# Patient Record
Sex: Male | Born: 1970 | Race: Black or African American | Hispanic: No | Marital: Single | Smoking: Never smoker
Health system: Southern US, Community
[De-identification: ages and names within clinical notes are randomized; demographics above are authoritative.]

## PROBLEM LIST (undated history)

## (undated) DIAGNOSIS — F329 Major depressive disorder, single episode, unspecified: Secondary | ICD-10-CM

## (undated) DIAGNOSIS — E079 Disorder of thyroid, unspecified: Secondary | ICD-10-CM

## (undated) DIAGNOSIS — F32A Depression, unspecified: Secondary | ICD-10-CM

## (undated) DIAGNOSIS — F419 Anxiety disorder, unspecified: Secondary | ICD-10-CM

## (undated) DIAGNOSIS — L732 Hidradenitis suppurativa: Secondary | ICD-10-CM

---

## 2007-11-10 ENCOUNTER — Ambulatory Visit: Payer: Self-pay | Admitting: Specialist

## 2007-11-19 ENCOUNTER — Emergency Department: Payer: Self-pay | Admitting: Emergency Medicine

## 2007-11-19 ENCOUNTER — Other Ambulatory Visit: Payer: Self-pay

## 2007-11-21 ENCOUNTER — Ambulatory Visit: Payer: Self-pay | Admitting: General Surgery

## 2007-12-02 ENCOUNTER — Ambulatory Visit: Payer: Self-pay | Admitting: General Surgery

## 2008-09-22 ENCOUNTER — Emergency Department: Payer: Self-pay | Admitting: Emergency Medicine

## 2009-08-08 ENCOUNTER — Emergency Department: Payer: Self-pay | Admitting: Emergency Medicine

## 2009-08-22 ENCOUNTER — Emergency Department: Payer: Self-pay | Admitting: Emergency Medicine

## 2009-12-06 ENCOUNTER — Emergency Department (HOSPITAL_COMMUNITY): Admission: EM | Admit: 2009-12-06 | Discharge: 2009-12-06 | Payer: Self-pay | Admitting: Emergency Medicine

## 2010-06-15 LAB — URINALYSIS, ROUTINE W REFLEX MICROSCOPIC
Glucose, UA: NEGATIVE mg/dL
Ketones, ur: NEGATIVE mg/dL
Protein, ur: NEGATIVE mg/dL
Urobilinogen, UA: 0.2 mg/dL (ref 0.0–1.0)

## 2010-06-15 LAB — POCT I-STAT, CHEM 8
Calcium, Ion: 1.18 mmol/L (ref 1.12–1.32)
Glucose, Bld: 85 mg/dL (ref 70–99)
HCT: 50 % (ref 39.0–52.0)
Hemoglobin: 17 g/dL (ref 13.0–17.0)
Potassium: 4 mEq/L (ref 3.5–5.1)

## 2010-06-15 LAB — URINE CULTURE
Colony Count: NO GROWTH
Culture: NO GROWTH

## 2010-11-15 ENCOUNTER — Emergency Department: Payer: Self-pay | Admitting: Emergency Medicine

## 2011-05-14 IMAGING — CT CT ABD-PELV W/O CM
2 of 4 series · 17 of 46 positions shown, 19 images · non-contrast
Comparison: None

CLINICAL DATA: Left-sided back pain.  Nausea, vomiting.  History of
stones.

CT ABDOMEN AND PELVIS WITHOUT CONTRAST
TECHNIQUE: Multidetector CT imaging of the abdomen and pelvis was
performed following the standard protocol without intravenous
contrast.

[Series 2: a/p w/o 5.0 b31f st · axial · non-contrast · 0.87mm/px · z∈[+774,+1244]mm · 14 of 104 slices shown, 16 images]
[im 5/104  soft-tissue]
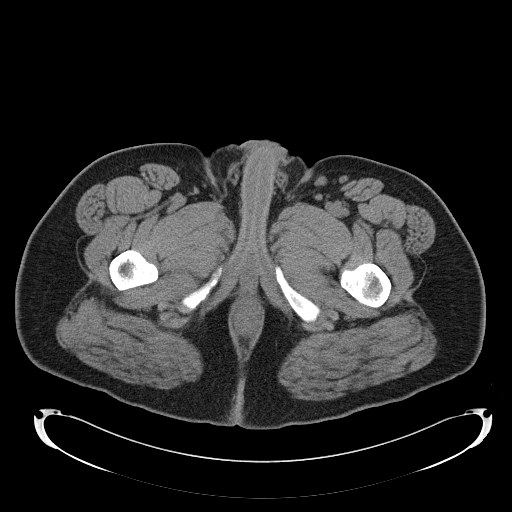
[im 5/104  bone]
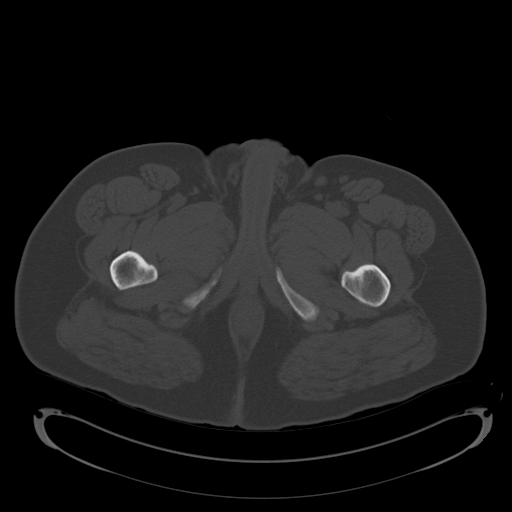
[im 13/104  soft-tissue]
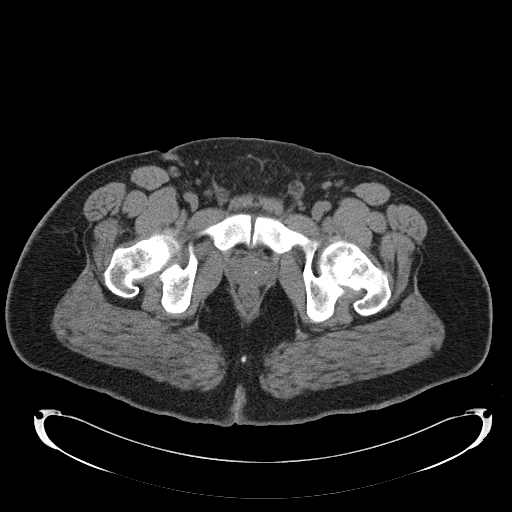
[im 22/104  soft-tissue]
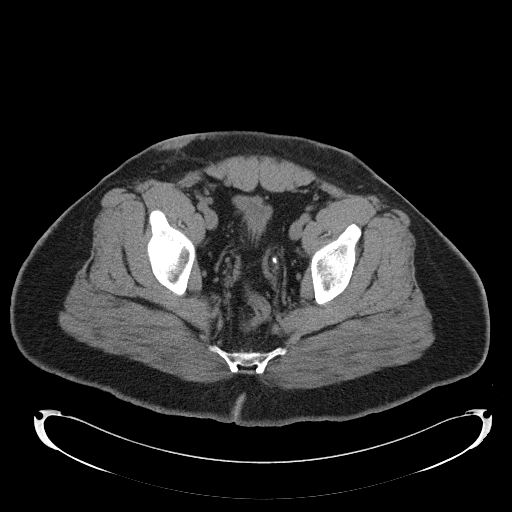
[im 26/104  soft-tissue]
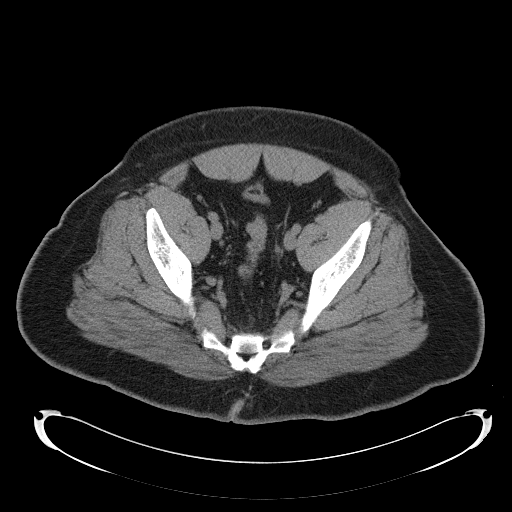
[im 35/104  soft-tissue]
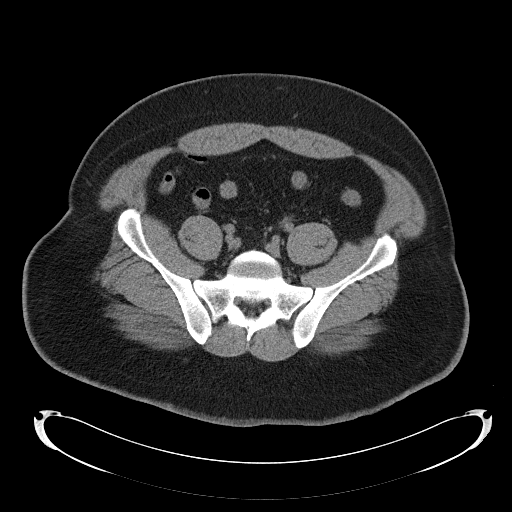
[im 43/104  soft-tissue]
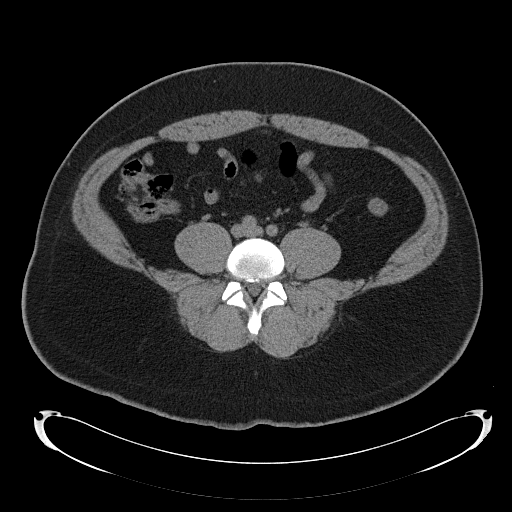
[im 48/104  soft-tissue]
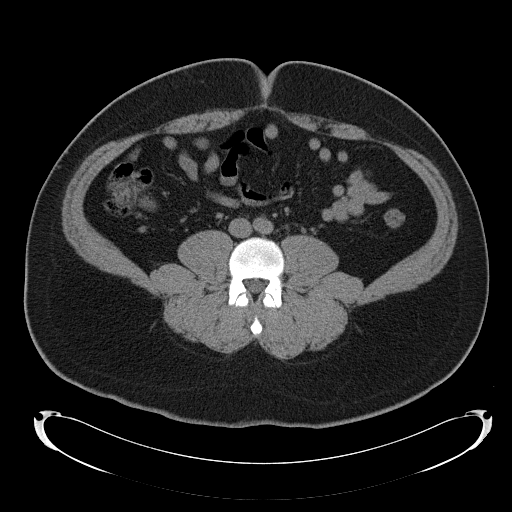
[im 56/104  soft-tissue]
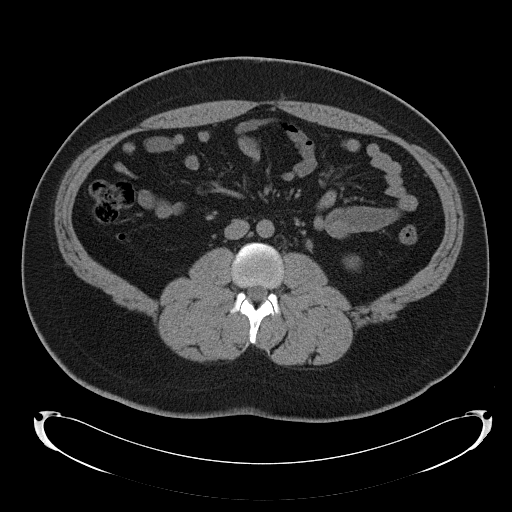
[im 61/104  soft-tissue]
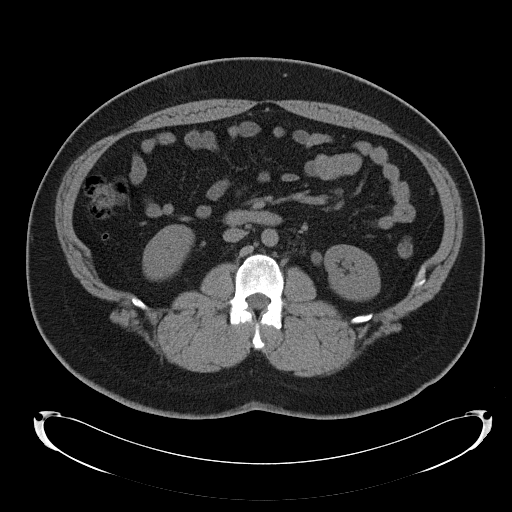
[im 61/104  bone]
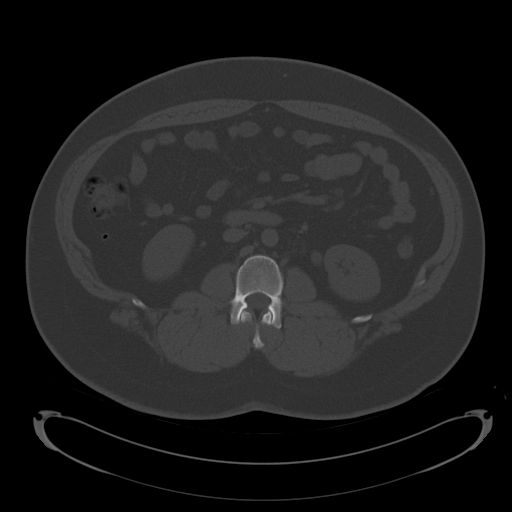
[im 69/104  soft-tissue]
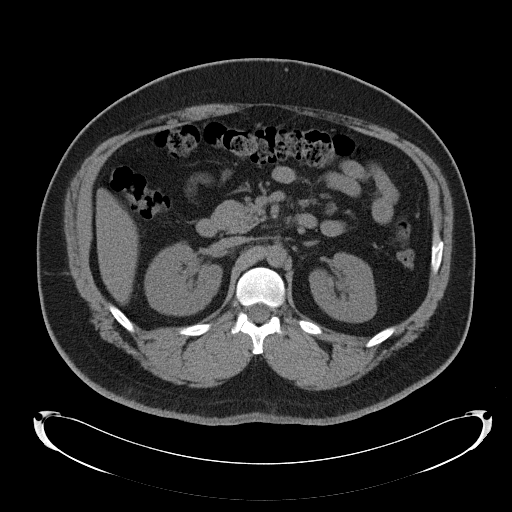
[im 78/104  soft-tissue]
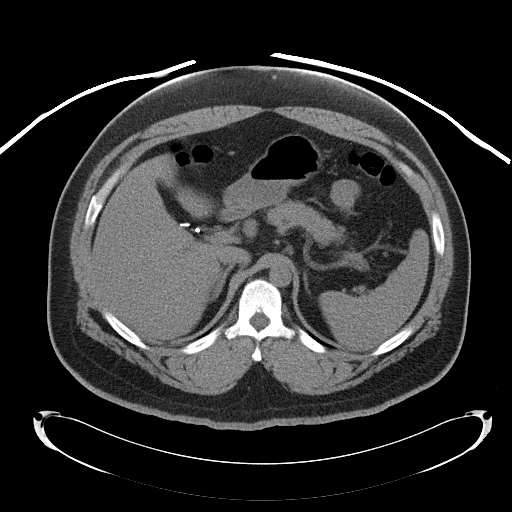
[im 82/104  soft-tissue]
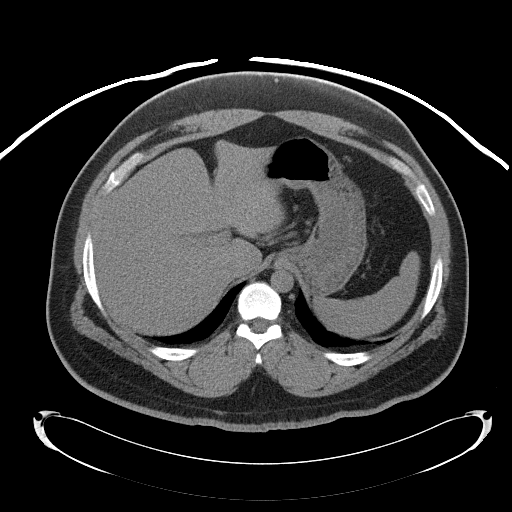
[im 91/104  soft-tissue]
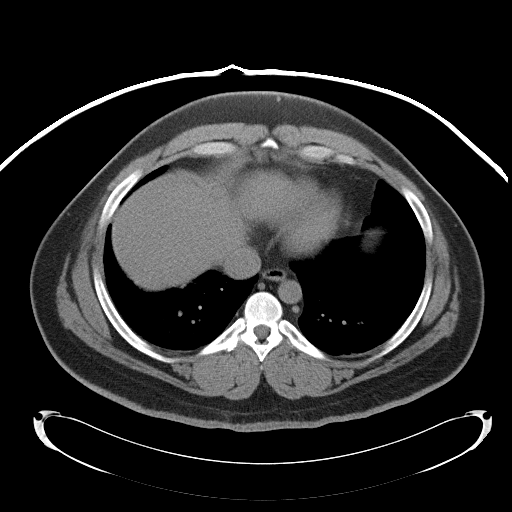
[im 99/104  soft-tissue]
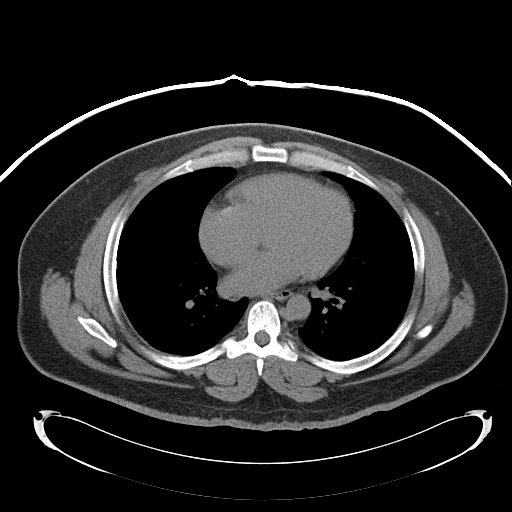

[Series 5: a/p w/o 2.0 spo cor st · coronal · non-contrast · 1.01mm/px · 3 of 162 slices shown]
[im 54/162  soft-tissue]
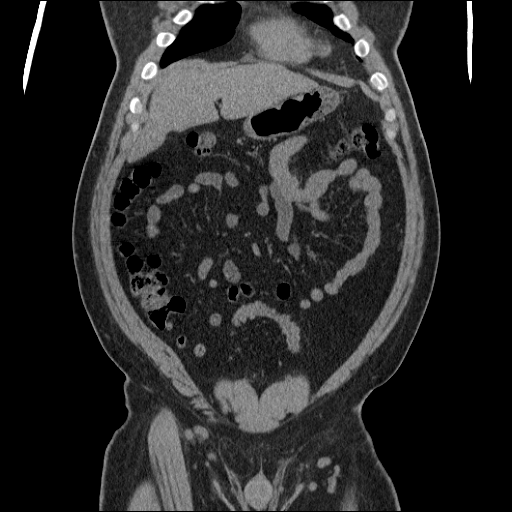
[im 72/162  soft-tissue]
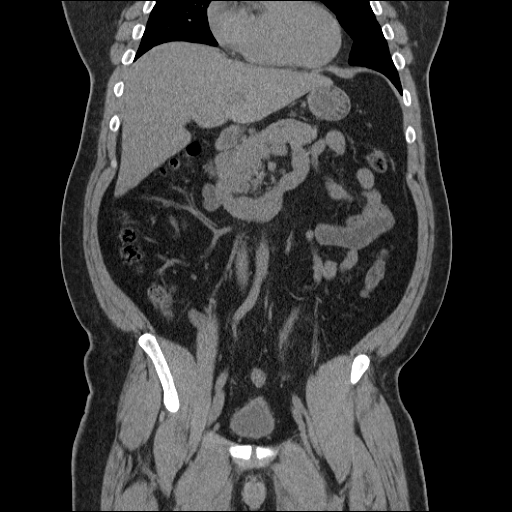
[im 90/162  soft-tissue]
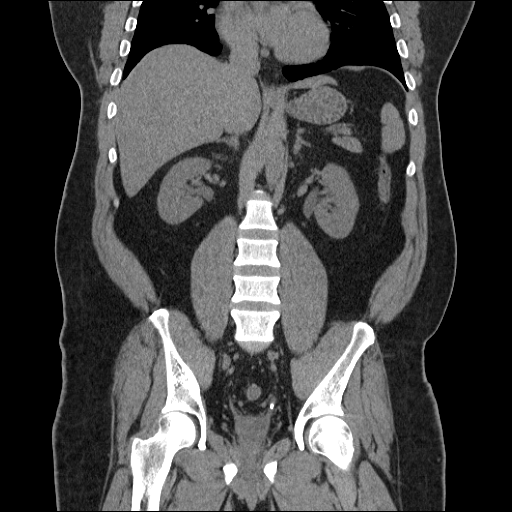

[17 of 46 positions shown; findings below may reference images not displayed]

FINDINGS: Just proximal to the ureteral vesicle junction, there is
a 4 mm calculus on the left.  This causes minimal periureteral
stranding and mild left hydronephrosis.  There are intrarenal
calculi bilaterally, measuring 3 - 4 mm each.

There is streaky atelectasis at the lung bases.  No focal
abnormality is identified within the liver, spleen, pancreas,
adrenal glands.  The appendix is well seen and has a normal
appearance.  No evidence for bowel obstruction or bowel wall
thickening.  No retroperitoneal adenopathy or ascites. Patient has
had prior cholecystectomy.

Within the right lower anterior abdominal wall, there is
subcutaneous soft tissue density, raising the question of recent
injections.
IMPRESSION: 1.  Left distal ureteral calculus measuring 4 mm, resulting in mild
left hydronephrosis.
2.  Numerous intrarenal calculi bilaterally.

## 2018-06-22 ENCOUNTER — Emergency Department

## 2018-06-22 ENCOUNTER — Other Ambulatory Visit: Payer: Self-pay

## 2018-06-22 ENCOUNTER — Emergency Department: Payer: Self-pay

## 2018-06-22 ENCOUNTER — Emergency Department
Admission: EM | Admit: 2018-06-22 | Discharge: 2018-06-22 | Disposition: A | Discharge status: Home / Self Care | Attending: Emergency Medicine | Admitting: Emergency Medicine

## 2018-06-22 DIAGNOSIS — S46911A Strain of unspecified muscle, fascia and tendon at shoulder and upper arm level, right arm, initial encounter: Secondary | ICD-10-CM | POA: Diagnosis not present

## 2018-06-22 DIAGNOSIS — Y999 Unspecified external cause status: Secondary | ICD-10-CM | POA: Diagnosis not present

## 2018-06-22 DIAGNOSIS — S0083XA Contusion of other part of head, initial encounter: Secondary | ICD-10-CM | POA: Insufficient documentation

## 2018-06-22 DIAGNOSIS — T07XXXA Unspecified multiple injuries, initial encounter: Secondary | ICD-10-CM

## 2018-06-22 DIAGNOSIS — Y9389 Activity, other specified: Secondary | ICD-10-CM | POA: Insufficient documentation

## 2018-06-22 DIAGNOSIS — S060X1A Concussion with loss of consciousness of 30 minutes or less, initial encounter: Secondary | ICD-10-CM

## 2018-06-22 DIAGNOSIS — Z23 Encounter for immunization: Secondary | ICD-10-CM | POA: Diagnosis not present

## 2018-06-22 DIAGNOSIS — S0990XA Unspecified injury of head, initial encounter: Secondary | ICD-10-CM

## 2018-06-22 DIAGNOSIS — Y92149 Unspecified place in prison as the place of occurrence of the external cause: Secondary | ICD-10-CM | POA: Insufficient documentation

## 2018-06-22 DIAGNOSIS — S0181XA Laceration without foreign body of other part of head, initial encounter: Secondary | ICD-10-CM

## 2018-06-22 HISTORY — DX: Anxiety disorder, unspecified: F41.9

## 2018-06-22 HISTORY — DX: Major depressive disorder, single episode, unspecified: F32.9

## 2018-06-22 HISTORY — DX: Depression, unspecified: F32.A

## 2018-06-22 MED ORDER — TETANUS-DIPHTH-ACELL PERTUSSIS 5-2.5-18.5 LF-MCG/0.5 IM SUSP
0.5000 mL | Freq: Once | INTRAMUSCULAR | Status: AC
  Administered 2018-06-22: 0.5 mL via INTRAMUSCULAR
  Filled 2018-06-22: qty 0.5

## 2018-06-22 MED ORDER — OXYCODONE-ACETAMINOPHEN 5-325 MG PO TABS
1.0000 | ORAL_TABLET | Freq: Once | ORAL | Status: AC
Start: 2018-06-22 — End: 2018-06-22
  Administered 2018-06-22: 1 via ORAL
  Filled 2018-06-22: qty 1

## 2018-06-22 MED ORDER — OXYCODONE-ACETAMINOPHEN 5-325 MG PO TABS
1.0000 | ORAL_TABLET | Freq: Once | ORAL | Status: AC
  Administered 2018-06-22: 1 via ORAL
  Filled 2018-06-22: qty 1
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
## 2018-06-22 NOTE — ED Notes (Signed)
Pt and officers verbalized understanding of discharge instructions. NAD at this time.
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
## 2018-06-22 NOTE — ED Notes (Addendum)
Pt changed and placed in hospital gown and wound above eye cleaned and dressed to prevent re-occurrence of bleeding. Pt with c/o of head and right side pain. Pt states unable to lift right arm above head. Pt has large hematoma to forehead. Pt has multiple scraps and marks on face and torso. MD McShane at bedside for assessment.

## 2018-08-25 ENCOUNTER — Encounter: Payer: Self-pay | Admitting: Emergency Medicine

## 2018-08-25 ENCOUNTER — Emergency Department: Payer: Medicare Other

## 2018-08-25 ENCOUNTER — Emergency Department
Admission: EM | Admit: 2018-08-25 | Discharge: 2018-08-26 | Disposition: A | Discharge status: Home / Self Care | Payer: Medicare Other | Attending: Emergency Medicine | Admitting: Emergency Medicine

## 2018-08-25 ENCOUNTER — Other Ambulatory Visit: Payer: Self-pay

## 2018-08-25 DIAGNOSIS — R509 Fever, unspecified: Secondary | ICD-10-CM | POA: Diagnosis present

## 2018-08-25 DIAGNOSIS — Z20828 Contact with and (suspected) exposure to other viral communicable diseases: Secondary | ICD-10-CM | POA: Insufficient documentation

## 2018-08-25 DIAGNOSIS — J111 Influenza due to unidentified influenza virus with other respiratory manifestations: Secondary | ICD-10-CM | POA: Insufficient documentation

## 2018-08-25 LAB — COMPREHENSIVE METABOLIC PANEL
ALT: 29 U/L (ref 0–44)
AST: 22 U/L (ref 15–41)
Albumin: 3.4 g/dL — ABNORMAL LOW (ref 3.5–5.0)
Alkaline Phosphatase: 66 U/L (ref 38–126)
Anion gap: 9 (ref 5–15)
BUN: 18 mg/dL (ref 6–20)
CO2: 23 mmol/L (ref 22–32)
Calcium: 8.7 mg/dL — ABNORMAL LOW (ref 8.9–10.3)
Chloride: 106 mmol/L (ref 98–111)
Creatinine, Ser: 1.11 mg/dL (ref 0.61–1.24)
GFR calc Af Amer: >60 mL/min (ref >60)
GFR calc non Af Amer: >60 mL/min (ref >60)
Glucose, Bld: 112 mg/dL — ABNORMAL HIGH (ref 70–99)
Potassium: 3.8 mmol/L (ref 3.5–5.1)
Sodium: 138 mmol/L (ref 135–145)
Total Bilirubin: 0.4 mg/dL (ref 0.3–1.2)
Total Protein: 8.3 g/dL — ABNORMAL HIGH (ref 6.5–8.1)

## 2018-08-25 LAB — CBC
HCT: 47.9 % (ref 39.0–52.0)
Hemoglobin: 14.9 g/dL (ref 13.0–17.0)
MCH: 25.5 pg — ABNORMAL LOW (ref 26.0–34.0)
MCHC: 31.1 g/dL (ref 30.0–36.0)
MCV: 81.9 fL (ref 80.0–100.0)
Platelets: 255 10*3/uL (ref 150–400)
RBC: 5.85 MIL/uL — ABNORMAL HIGH (ref 4.22–5.81)
RDW: 13.8 % (ref 11.5–15.5)
WBC: 8 10*3/uL (ref 4.0–10.5)
nRBC: 0 % (ref 0.0–0.2)
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
## 2018-08-26 LAB — SARS CORONAVIRUS 2 BY RT PCR (HOSPITAL ORDER, PERFORMED IN ~~LOC~~ HOSPITAL LAB): SARS Coronavirus 2: NEGATIVE

## 2018-08-26 MED ORDER — SODIUM CHLORIDE 0.9 % IV BOLUS
1000.0000 mL | Freq: Once | INTRAVENOUS | Status: AC
Start: 2018-08-26 — End: 2018-08-26
  Administered 2018-08-26: 02:00:00 1000 mL via INTRAVENOUS

## 2018-08-26 MED ORDER — ALBUTEROL SULFATE HFA 108 (90 BASE) MCG/ACT IN AERS: 2.0000 | INHALATION_SPRAY | Freq: Four times a day (QID) | RESPIRATORY_TRACT | 0 refills | Status: AC | PRN

## 2018-08-26 NOTE — Discharge Instructions (Addendum)
Please seek medical attention for any high fevers, chest pain, shortness of breath, change in behavior, persistent vomiting, bloody stool or any other new or concerning symptoms.  

## 2018-08-26 NOTE — ED Notes (Signed)
Vitals stable. NAD. AAOx4 

## 2021-09-05 ENCOUNTER — Other Ambulatory Visit (HOSPITAL_COMMUNITY): Payer: Self-pay

## 2022-07-03 ENCOUNTER — Emergency Department (HOSPITAL_COMMUNITY): Payer: Medicare Other

## 2022-07-03 ENCOUNTER — Encounter (HOSPITAL_COMMUNITY): Payer: Self-pay

## 2022-07-03 ENCOUNTER — Emergency Department (HOSPITAL_COMMUNITY)
Admission: EM | Admit: 2022-07-03 | Discharge: 2022-07-04 | Disposition: A | Discharge status: Home / Self Care | Payer: Medicare Other | Attending: Emergency Medicine | Admitting: Emergency Medicine

## 2022-07-03 ENCOUNTER — Other Ambulatory Visit: Payer: Self-pay

## 2022-07-03 DIAGNOSIS — G43809 Other migraine, not intractable, without status migrainosus: Secondary | ICD-10-CM | POA: Insufficient documentation

## 2022-07-03 DIAGNOSIS — R0789 Other chest pain: Secondary | ICD-10-CM | POA: Insufficient documentation

## 2022-07-03 DIAGNOSIS — R519 Headache, unspecified: Secondary | ICD-10-CM | POA: Diagnosis present

## 2022-07-03 HISTORY — DX: Hidradenitis suppurativa: L73.2

## 2022-07-03 HISTORY — DX: Disorder of thyroid, unspecified: E07.9

## 2022-07-03 LAB — CBC
HCT: 45.1 % (ref 39.0–52.0)
Hemoglobin: 14.4 g/dL (ref 13.0–17.0)
MCH: 26.1 pg (ref 26.0–34.0)
MCHC: 31.9 g/dL (ref 30.0–36.0)
MCV: 81.9 fL (ref 80.0–100.0)
Platelets: 263 10*3/uL (ref 150–400)
RBC: 5.51 MIL/uL (ref 4.22–5.81)
RDW: 15.2 % (ref 11.5–15.5)
WBC: 10 10*3/uL (ref 4.0–10.5)
nRBC: 0 % (ref 0.0–0.2)

## 2022-07-03 LAB — BASIC METABOLIC PANEL
Anion gap: 8 (ref 5–15)
BUN: 13 mg/dL (ref 6–20)
CO2: 23 mmol/L (ref 22–32)
Calcium: 8.9 mg/dL (ref 8.9–10.3)
Chloride: 105 mmol/L (ref 98–111)
Creatinine, Ser: 1.07 mg/dL (ref 0.61–1.24)
GFR, Estimated: >60 mL/min (ref >60)
Glucose, Bld: 120 mg/dL — ABNORMAL HIGH (ref 70–99)
Potassium: 3.7 mmol/L (ref 3.5–5.1)
Sodium: 136 mmol/L (ref 135–145)

## 2022-07-03 LAB — TROPONIN I (HIGH SENSITIVITY): Troponin I (High Sensitivity): 4 ng/L (ref <18)

## 2022-07-03 MED ORDER — PROCHLORPERAZINE EDISYLATE 10 MG/2ML IJ SOLN
10.0000 mg | Freq: Once | INTRAMUSCULAR | Status: AC
  Administered 2022-07-03: 10 mg via INTRAVENOUS
  Filled 2022-07-03: qty 2

## 2022-07-03 MED ORDER — DIPHENHYDRAMINE HCL 50 MG/ML IJ SOLN
12.5000 mg | Freq: Once | INTRAMUSCULAR | Status: AC
  Administered 2022-07-03: 12.5 mg via INTRAVENOUS
  Filled 2022-07-03: qty 1

## 2022-07-03 NOTE — ED Triage Notes (Addendum)
Pt presents with a 4 hour hx of an achy substernal CP without radiation that started while he was lying on the cough. Pt reports associated ShOB, nausea, and a clammy feeling. Pt took ibuprofen 600 mg at 20:30 without relief.

## 2022-07-03 NOTE — ED Notes (Addendum)
Patient transported to XR. 

## 2022-07-04 LAB — URINALYSIS, ROUTINE W REFLEX MICROSCOPIC
Bilirubin Urine: NEGATIVE
Glucose, UA: NEGATIVE mg/dL
Hgb urine dipstick: NEGATIVE
Ketones, ur: NEGATIVE mg/dL
Leukocytes,Ua: NEGATIVE
Nitrite: NEGATIVE
Protein, ur: NEGATIVE mg/dL
Specific Gravity, Urine: 1.015 (ref 1.005–1.030)
pH: 5 (ref 5.0–8.0)

## 2022-07-04 LAB — TROPONIN I (HIGH SENSITIVITY): Troponin I (High Sensitivity): 5 ng/L (ref <18)

## 2022-07-04 NOTE — ED Provider Notes (Signed)
[place] Provider Note   CSN: [id number] Arrival date & time: 07/03/22  2159     History  Chief Complaint  Patient presents with   Chest Pain    Robert Branch is a 52 y.o. male.  HPI     Is a 52 year old male who presents with chest pain and headache.  Patient reports a 4-hour history of anterior chest discomfort.  He states he is not able to describe it.  It does not radiate.  Nothing seems to make it better or worse including exertion.  Denies history of heart disease, diabetes, hypertension, smoking.  Patient also is reporting headache.  He describes pain behind the right eye.  He has a history of headaches and migraines in the past.  This is consistent with his prior headache.  Denies any strokelike symptoms.  Home Medications Prior to Admission medications   Medication Sig Start Date End Date Taking? Authorizing Provider  FLUoxetine (PROZAC) 40 MG capsule Take 80 mg by mouth daily. 03/20/22  Yes [provider]  gabapentin (NEURONTIN) 300 MG capsule Take by mouth. 02/05/22  Yes [provider]  levothyroxine (SYNTHROID) 100 MCG tablet See admin instructions. 08/14/21  Yes [provider]  LORazepam (ATIVAN) 0.5 MG tablet Take 1 tablet by mouth 2 (two) times daily as needed. 06/20/22  Yes [provider]  risperiDONE (RISPERDAL) 1 MG tablet Take by mouth. 06/19/22 07/20/22 Yes [provider]  albuterol (VENTOLIN HFA) 108 (90 Base) MCG/ACT inhaler Inhale 2 puffs into the lungs every 6 (six) hours as needed for wheezing or shortness of breath. 08/26/18   Nance Pear, MD      Allergies    Demerol [meperidine hcl] and Fluoxetine    Review of Systems   Review of Systems  Constitutional:  Negative for fever.  Respiratory:  Negative for cough and shortness of breath.   Cardiovascular:  Positive for chest pain. Negative for leg swelling.  Neurological:  Positive for headaches.  All  other systems reviewed and are negative.   Physical Exam Updated Vital Signs BP 115/75   Pulse 73   Temp 97.7 F (36.5 C) (Oral)   Resp 18   Ht 1.676 m (5\' 6" )   Wt 99.8 kg   SpO2 93%   BMI 35.51 kg/m  Physical Exam Vitals and nursing note reviewed.  Constitutional:      Appearance: He is well-developed. He is obese. He is not ill-appearing.  HENT:     Head: Normocephalic and atraumatic.  Eyes:     Pupils: Pupils are equal, round, and reactive to light.  Cardiovascular:     Rate and Rhythm: Normal rate and regular rhythm.     Heart sounds: Normal heart sounds. No murmur heard. Pulmonary:     Effort: Pulmonary effort is normal. No respiratory distress.     Breath sounds: Normal breath sounds. No wheezing.  Abdominal:     General: Bowel sounds are normal.     Palpations: Abdomen is soft.     Tenderness: There is no abdominal tenderness. There is no rebound.  Musculoskeletal:     Cervical back: Neck supple.     Right lower leg: No edema.     Left lower leg: No edema.  Lymphadenopathy:     Cervical: No cervical adenopathy.  Skin:    General: Skin is warm and dry.  Neurological:     Mental Status: He is alert and oriented to person, place, and time.  Psychiatric:        Mood and Affect: Mood normal.     ED Results / Procedures / Treatments   Labs (all labs ordered are listed, but only abnormal results are displayed) Labs Reviewed  BASIC METABOLIC PANEL - Abnormal; Notable for the following components:      Result Value   Glucose, Bld 120 (*)    All other components within normal limits  URINALYSIS, ROUTINE W REFLEX MICROSCOPIC - Abnormal; Notable for the following components:   APPearance HAZY (*)    All other components within normal limits  CBC  TROPONIN I (HIGH SENSITIVITY)  TROPONIN I (HIGH SENSITIVITY)    EKG EKG Interpretation  Date/Time:  Tuesday July 03 2022 22:03:50 EDT Ventricular Rate:  91 PR Interval:  134 QRS Duration: 80 QT  Interval:  392 QTC Calculation: 482 R Axis:   40 Text Interpretation: Normal sinus rhythm Prolonged QT Abnormal ECG When compared with ECG of 19-Nov-2007 20:05, QT has lengthened Confirmed by Thayer Jew [phone]) on 07/03/2022 11:26:40 PM  Radiology DG Chest 2 View  Result Date: 07/03/2022 CLINICAL DATA:  Chest pain EXAM: CHEST - 2 VIEW COMPARISON:  08/25/2018 FINDINGS: Chronic bronchitic changes. No consolidation or effusion. Patchy atelectasis or infiltrate at the left lower lung. Normal cardiac size. No pneumothorax. IMPRESSION: Bronchitic changes with patchy atelectasis or minimal infiltrate at the left lung base Electronically Signed   By: Donavan Foil M.D.   On: 07/03/2022 22:49    Procedures Procedures    Medications Ordered in ED Medications  prochlorperazine (COMPAZINE) injection 10 mg (10 mg Intravenous Given 07/03/22 2352)  diphenhydrAMINE (BENADRYL) injection 12.5 mg (12.5 mg Intravenous Given 07/03/22 2352)    ED Course/ Medical Decision Making/ A&P                             Medical Decision Making Amount and/or Complexity of Data Reviewed Labs: ordered. Radiology: ordered.  Risk Prescription drug management.   This patient presents to the ED for concern of headache, chest discomfort, this involves an extensive number of treatment options, and is a complaint that carries with it a high risk of complications and morbidity.  I considered the following differential and admission for this acute, potentially life threatening condition.  The differential diagnosis includes ACS, PE, pneumothorax, pneumonia,  MDM:    This is a 52 year old male who presents with chest pain and headache.  He is overall nontoxic and vital signs are reassuring.  His physical exam is fairly benign.  He was given a migraine cocktail.  EKG without acute ischemic changes.  Basic labs including 2 troponins are negative.  Doubt PE.  Chest x-ray does suggest possible infiltrate and bronchitis changes  although clinically he is not presenting with primary respiratory symptoms.  He has no leukocytosis.  Patient reports improvement after migraine cocktail.  Will refer to outpatient cardiology regarding chest pain.  He is fairly low risk otherwise.  (Labs, imaging, consults)  Labs: I Ordered, and personally interpreted labs.  The pertinent results include: CBC, BMP, troponin x 2  Imaging Studies ordered: I ordered imaging studies including chest x-ray I independently visualized and interpreted imaging. I agree with the radiologist interpretation  Additional history obtained from wife at bedside.  External records from outside source obtained and reviewed including prior evaluations  Cardiac Monitoring: The patient was maintained on a cardiac monitor.  If on the cardiac monitor, I personally viewed and interpreted the cardiac  monitored which showed an underlying rhythm of: Sinus rhythm  Reevaluation: After the interventions noted above, I reevaluated the patient and found that they have :improved  Social Determinants of Health:  lives independently  Disposition: Discharge  Co morbidities that complicate the patient evaluation  Past Medical History:  Diagnosis Date   Anxiety    Depression    Hydradenitis    Thyroid disease      Medicines Meds ordered this encounter  Medications   prochlorperazine (COMPAZINE) injection 10 mg   diphenhydrAMINE (BENADRYL) injection 12.5 mg    I have reviewed the patients home medicines and have made adjustments as needed  Problem List / ED Course: Problem List Items Addressed This Visit   None Visit Diagnoses     Atypical chest pain    -  Primary   Relevant Orders   Ambulatory referral to Cardiology   Other migraine without status migrainosus, not intractable       Relevant Medications   gabapentin (NEURONTIN) 300 MG capsule   FLUoxetine (PROZAC) 40 MG capsule                   Final Clinical Impression(s) / ED  Diagnoses Final diagnoses:  Atypical chest pain  Other migraine without status migrainosus, not intractable    Rx / DC Orders ED Discharge Orders          Ordered    Ambulatory referral to Cardiology        07/04/22 0043              Merryl Hacker, MD 07/04/22 [phone]

## 2022-07-04 NOTE — Discharge Instructions (Signed)
You were seen today for chest pain and headache.  Your workup is reassuring.  Follow-up with cardiology.

## 2022-09-06 ENCOUNTER — Ambulatory Visit: Payer: Medicare Other | Admitting: Cardiology
